# Patient Record
Sex: Female | Born: 1940 | Hispanic: Yes | Marital: Married | Smoking: Never smoker
Health system: Southern US, Community
[De-identification: ages and names within clinical notes are randomized; demographics above are authoritative.]

---

## 2016-05-06 ENCOUNTER — Encounter (HOSPITAL_COMMUNITY): Payer: Self-pay

## 2016-05-06 ENCOUNTER — Emergency Department (HOSPITAL_COMMUNITY)
Admission: EM | Admit: 2016-05-06 | Discharge: 2016-05-06 | Disposition: A | Discharge status: Home / Self Care | Payer: Self-pay | Attending: Emergency Medicine | Admitting: Emergency Medicine

## 2016-05-06 ENCOUNTER — Emergency Department (HOSPITAL_COMMUNITY): Payer: Self-pay

## 2016-05-06 DIAGNOSIS — M791 Myalgia, unspecified site: Secondary | ICD-10-CM

## 2016-05-06 DIAGNOSIS — R079 Chest pain, unspecified: Secondary | ICD-10-CM | POA: Insufficient documentation

## 2016-05-06 DIAGNOSIS — R319 Hematuria, unspecified: Secondary | ICD-10-CM

## 2016-05-06 LAB — BASIC METABOLIC PANEL
Anion gap: 7 (ref 5–15)
BUN: 7 mg/dL (ref 6–20)
CHLORIDE: 109 mmol/L (ref 101–111)
CO2: 25 mmol/L (ref 22–32)
CREATININE: 0.81 mg/dL (ref 0.44–1.00)
Calcium: 10.1 mg/dL (ref 8.9–10.3)
GFR calc Af Amer: >60 mL/min (ref >60)
GFR calc non Af Amer: >60 mL/min (ref >60)
Glucose, Bld: 103 mg/dL — ABNORMAL HIGH (ref 65–99)
Potassium: 4.5 mmol/L (ref 3.5–5.1)
Sodium: 141 mmol/L (ref 135–145)

## 2016-05-06 LAB — URINALYSIS, ROUTINE W REFLEX MICROSCOPIC
Bacteria, UA: NONE SEEN
Bilirubin Urine: NEGATIVE
Glucose, UA: NEGATIVE mg/dL
KETONES UR: NEGATIVE mg/dL
Nitrite: NEGATIVE
PROTEIN: NEGATIVE mg/dL
Specific Gravity, Urine: 1.008 (ref 1.005–1.030)
pH: 8 (ref 5.0–8.0)

## 2016-05-06 LAB — CBC
HEMATOCRIT: 37.8 % (ref 36.0–46.0)
Hemoglobin: 12.6 g/dL (ref 12.0–15.0)
MCH: 28.4 pg (ref 26.0–34.0)
MCHC: 33.3 g/dL (ref 30.0–36.0)
MCV: 85.1 fL (ref 78.0–100.0)
PLATELETS: 245 10*3/uL (ref 150–400)
RBC: 4.44 MIL/uL (ref 3.87–5.11)
RDW: 13.8 % (ref 11.5–15.5)
WBC: 5.5 10*3/uL (ref 4.0–10.5)

## 2016-05-06 LAB — I-STAT TROPONIN, ED: Troponin i, poc: 0 ng/mL (ref 0.00–0.08)

## 2016-05-06 LAB — CK: Total CK: 81 U/L (ref 38–234)

## 2016-05-06 MED ORDER — METHOCARBAMOL 500 MG PO TABS: 250.0000 mg | ORAL_TABLET | Freq: Three times a day (TID) | ORAL | 0 refills | Status: AC | PRN

## 2016-05-06 NOTE — Discharge Instructions (Signed)
Take care with a muscle relaxer. Follow-up with primary care doctor for further evaluation of the muscle pains and generalized weakness.

## 2016-05-06 NOTE — ED Notes (Signed)
ED Provider at bedside. 

## 2016-05-06 NOTE — ED Provider Notes (Signed)
[facility]-EMERGENCY DEPT Provider Note   CSN: [id number] Arrival date & time: 05/06/16  1203     History   Chief Complaint Chief Complaint  Patient presents with  . Dizziness  . Chest Pain    HPI Misty Hammond is a 76 y.o. female.  HPI Patient presents with dizziness and muscle pain. She's been having it for the last 4 months since she has been visiting from [place]. Pain hurts in all the muscles but mostly upper shoulders. Family members do most of the translating. No fevers. No chills. Has lost around 10 pounds. No abdominal pain. No diarrhea. No dysuria. No chest pain. No change in medications. She does have a doctor she sees in [place] and is reportedly healthy. No swelling or legs. States she got a B-12 shot that did not help with all of this.   History reviewed. No pertinent past medical history.  There are no active problems to display for this patient.   Past Surgical History:  Procedure Laterality Date  . CESAREAN SECTION      OB History    No data available       Home Medications    Prior to Admission medications   Medication Sig Start Date End Date Taking? Authorizing Provider  acetaminophen (TYLENOL) 500 MG tablet Take 500 mg by mouth every 6 (six) hours as needed for moderate pain.   Yes Historical Provider, MD  methocarbamol (ROBAXIN) 500 MG tablet Take 0.5-1 tablets (250-500 mg total) by mouth every 8 (eight) hours as needed for muscle spasms. 05/06/16   Benjiman CoreNathan Reona Zendejas, MD    Family History No family history on file.  Social History Social History  Substance Use Topics  . Smoking status: Never Smoker  . Smokeless tobacco: Never Used  . Alcohol use No     Allergies   Morphine and related and Lactose intolerance (gi)   Review of Systems Review of Systems  Constitutional: Positive for appetite change.  HENT: Negative for congestion.   Respiratory: Negative for chest tightness.   Cardiovascular: Negative for chest pain.  Gastrointestinal:  Negative for abdominal pain.  Genitourinary: Negative for dysuria.  Musculoskeletal: Positive for myalgias and neck pain.  Skin: Negative for pallor.  Neurological: Positive for light-headedness. Negative for numbness.  Psychiatric/Behavioral: Negative for confusion.     Physical Exam Updated Vital Signs BP (!) 118/52   Pulse 66   Temp 97.8 F (36.6 C) (Oral)   Resp 14   Ht 4\' 10"  (1.473 m)   Wt 110 lb (49.9 kg)   SpO2 100%   BMI 22.99 kg/m   Physical Exam  Constitutional: She appears well-developed.  HENT:  Head: Atraumatic.  Eyes: Pupils are equal, round, and reactive to light.  Neck: Neck supple.  Cardiovascular: Normal rate.   Pulmonary/Chest: Effort normal.  Abdominal: Soft.  Musculoskeletal: She exhibits no edema.  Neurological: She is alert.  No nystagmus. Finger-nose intact bilaterally. No Romberg. Normal gait.  Skin: Skin is warm. Capillary refill takes less than 2 seconds.  Psychiatric: She has a normal mood and affect.     ED Treatments / Results  Labs (all labs ordered are listed, but only abnormal results are displayed) Labs Reviewed  BASIC METABOLIC PANEL - Abnormal; Notable for the following:       Result Value   Glucose, Bld 103 (*)    All other components within normal limits  URINALYSIS, ROUTINE W REFLEX MICROSCOPIC - Abnormal; Notable for the following:    Color, Urine STRAW (*)  Hgb urine dipstick MODERATE (*)    Leukocytes, UA SMALL (*)    Squamous Epithelial / LPF 0-5 (*)    All other components within normal limits  CBC  CK  I-STAT TROPOININ, ED    EKG  EKG Interpretation  Date/Time:  Thursday May 06 2016 12:24:17 EST Ventricular Rate:  71 PR Interval:  150 QRS Duration: 78 QT Interval:  380 QTC Calculation: 412 R Axis:   55 Text Interpretation:  Normal sinus rhythm Nonspecific T wave abnormality Abnormal ECG Confirmed by Rubin Payor  MD, Harrold Donath [phone]) on 05/06/2016 3:03:08 PM       Radiology Dg Chest 2 View  Result  Date: 05/06/2016 CLINICAL DATA:  Dizziness for 3 days with chest pain radiating into the right neck and shoulder for 1 month. Nausea. EXAM: CHEST  2 VIEW COMPARISON:  The cardiomediastinal silhouette is within normal limits. There is mild anterior eventration of the right hemidiaphragm. The lungs are clear. No pleural effusion or pneumothorax is identified. No acute osseous abnormality is seen. FINDINGS: The heart size and mediastinal contours are within normal limits. Both lungs are clear. The visualized skeletal structures are unremarkable. IMPRESSION: No active cardiopulmonary disease. Electronically Signed   By: Sebastian Ache M.D.   On: 05/06/2016 13:05    Procedures Procedures (including critical care time)  Medications Ordered in ED Medications - No data to display   Initial Impression / Assessment and Plan / ED Course  I have reviewed the triage vital signs and the nursing notes.  Pertinent labs & imaging results that were available during my care of the patient were reviewed by me and considered in my medical decision making (see chart for details).     Patient with reported lightheadedness. No nausea or vomiting. Has had some neck pain. Labs reassuring. No vomiting. Has been going for the last 3 months. From [place]. Nonfocal exam. No nystagmus. Doubt a central vertigo. Labs reassuring but does have some mild hematuria. Normal CK. We'll give small dose of muscle relaxer. Will need urology follow-up and potentially find a primary care doctor.. Not orthostatic. Discharge home.  Final Clinical Impressions(s) / ED Diagnoses   Final diagnoses:  Muscle pain  Hematuria, unspecified type    New Prescriptions New Prescriptions   METHOCARBAMOL (ROBAXIN) 500 MG TABLET    Take 0.5-1 tablets (250-500 mg total) by mouth every 8 (eight) hours as needed for muscle spasms.     Benjiman Core, MD 05/06/16 [phone]

## 2016-05-06 NOTE — ED Notes (Signed)
Family verbalizes understanding of resource to set up PCP. Also verbalize understanding of DC teaching and medications. NAD. VSS.

## 2016-05-06 NOTE — ED Triage Notes (Signed)
Per pt's fmaily, Pt is coming from home with complaints of dizziness x3 days with chest pain that radiates to the right neck and shoulder x 1 month. Pt complains that she has been nauseous and unable to eat for the last couple days. Denies any vomiting. Alert and oriented x4.

## 2017-07-19 IMAGING — DX DG CHEST 2V
2 series · 2 of 2 positions shown · non-contrast
Comparison: The cardiomediastinal silhouette is within normal
limits.

CLINICAL DATA: Dizziness for 3 days with chest pain radiating into
the right neck and shoulder for 1 month. Nausea.

EXAM:
CHEST  2 VIEW

[chest pa]
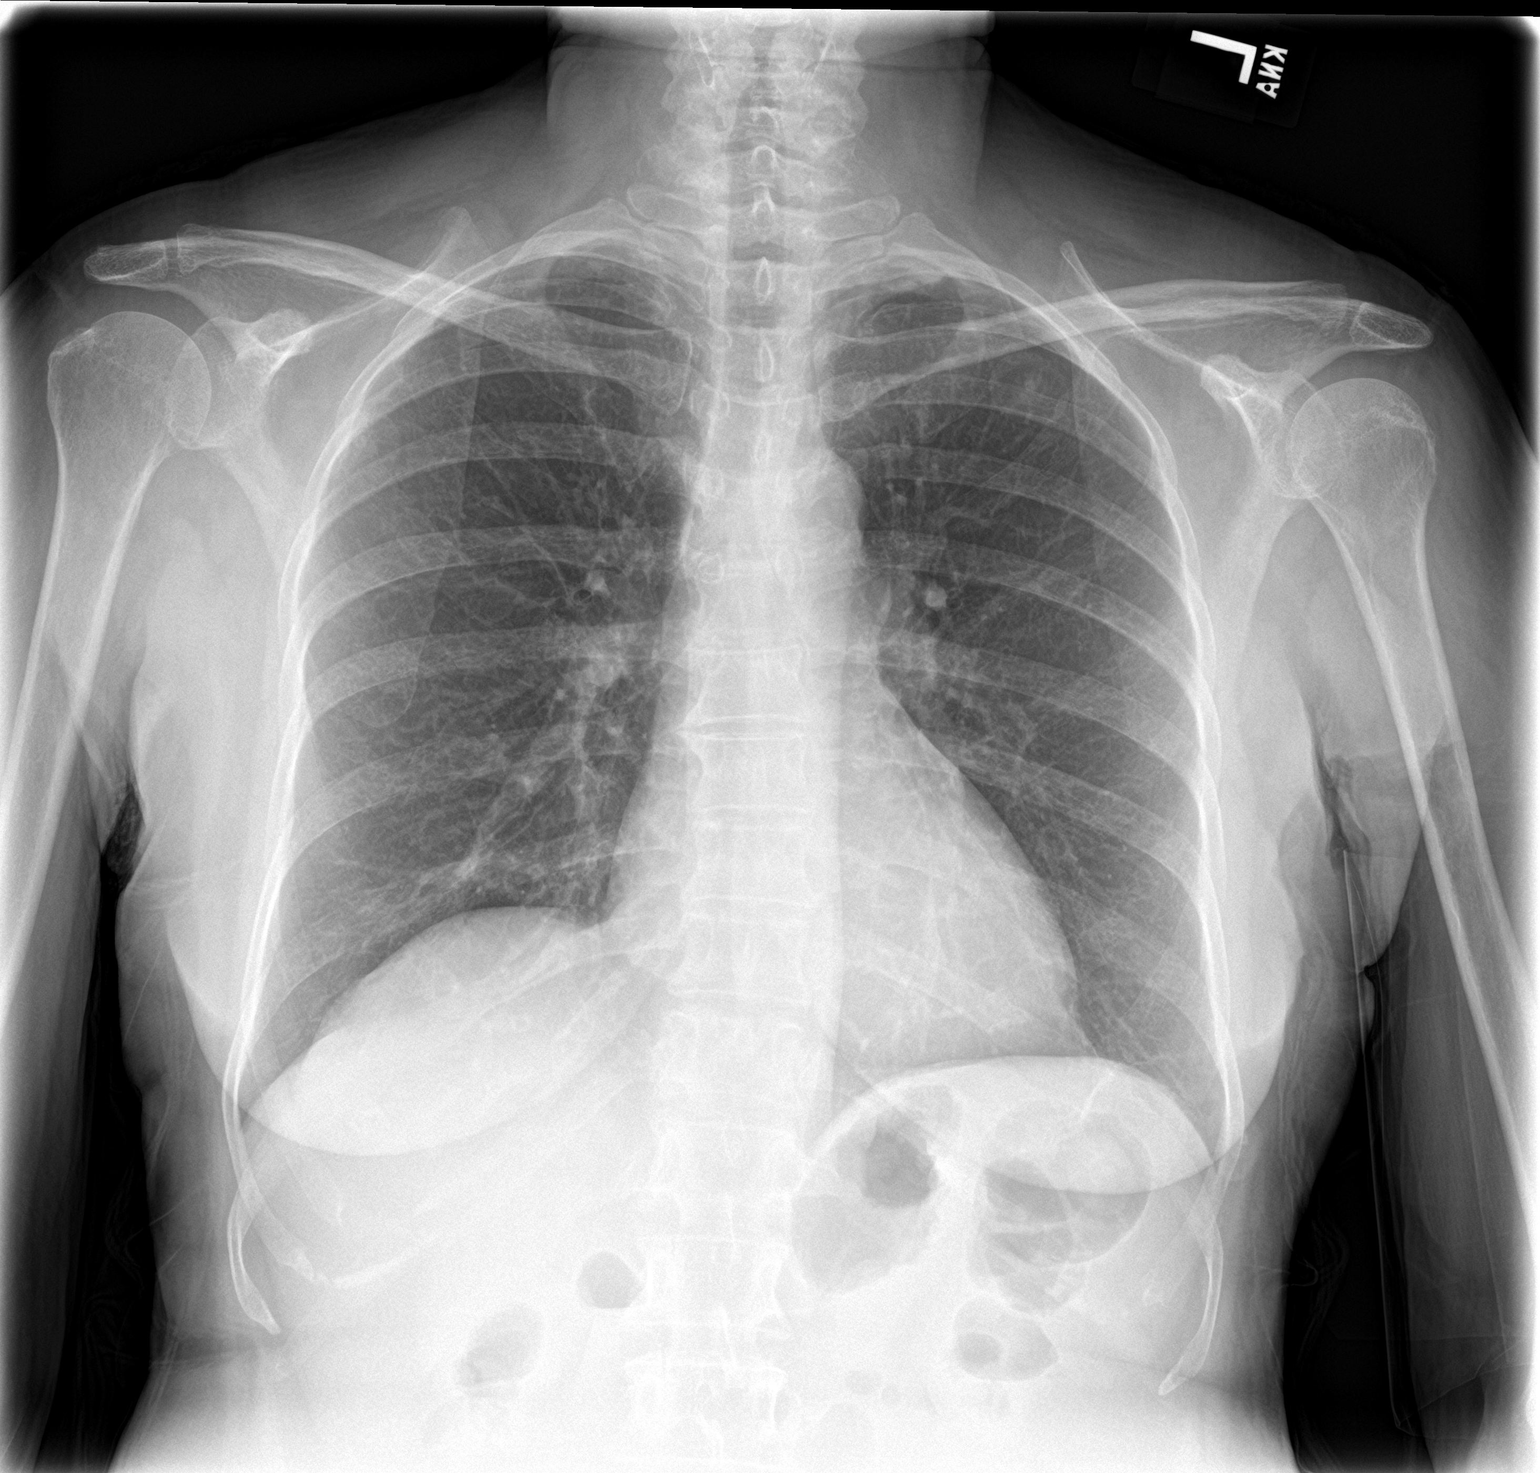

[chest lat]
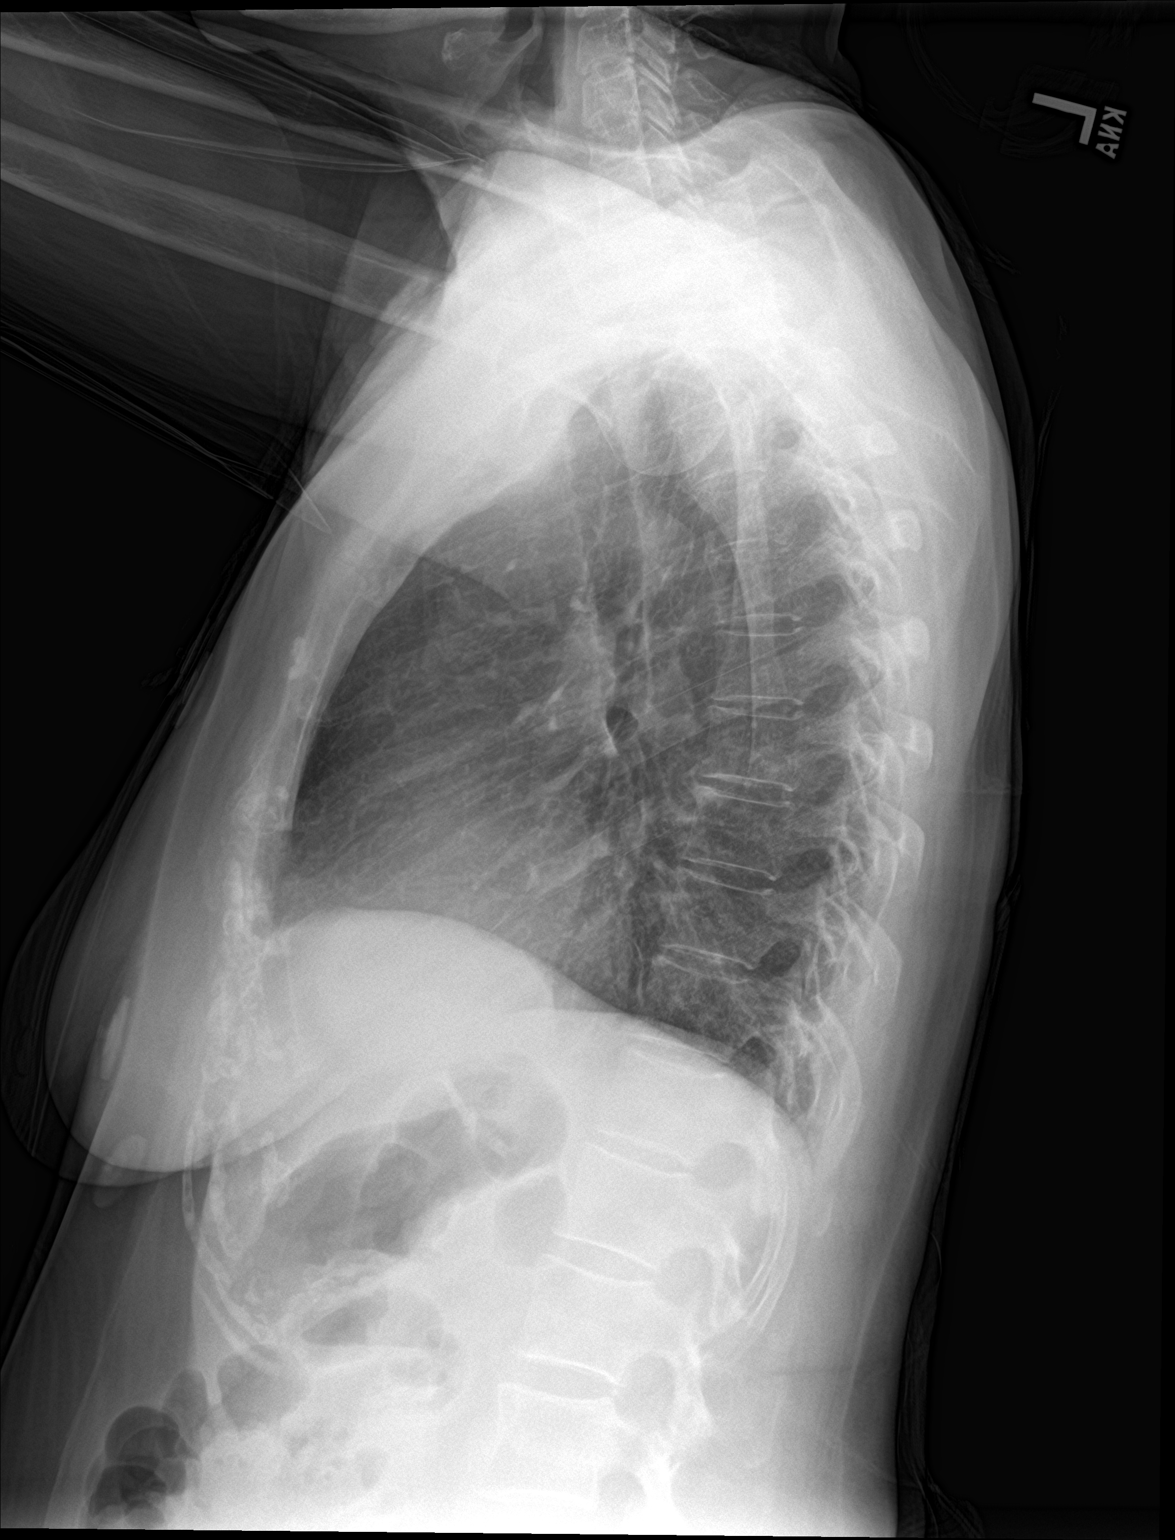

[2 of 2 positions shown; findings below may reference images not displayed]

There is mild anterior eventration of the right
hemidiaphragm. The lungs are clear. No pleural effusion or
pneumothorax is identified. No acute osseous abnormality is seen.
FINDINGS: The heart size and mediastinal contours are within normal limits.
Both lungs are clear. The visualized skeletal structures are
unremarkable.
IMPRESSION: No active cardiopulmonary disease.
